# Patient Record
Sex: Male | Born: 1950 | Race: Black or African American | Hispanic: No | Marital: Single | Smoking: Former smoker
Health system: Southern US, Community
[De-identification: ages and names within clinical notes are randomized; demographics above are authoritative.]

## PROBLEM LIST (undated history)

## (undated) DIAGNOSIS — I219 Acute myocardial infarction, unspecified: Secondary | ICD-10-CM

## (undated) DIAGNOSIS — M1990 Unspecified osteoarthritis, unspecified site: Secondary | ICD-10-CM

---

## 2014-10-30 ENCOUNTER — Emergency Department (HOSPITAL_COMMUNITY)
Admission: EM | Admit: 2014-10-30 | Discharge: 2014-10-30 | Disposition: A | Discharge status: Home / Self Care | Payer: Medicare Other | Attending: Emergency Medicine | Admitting: Emergency Medicine

## 2014-10-30 ENCOUNTER — Encounter (HOSPITAL_COMMUNITY): Payer: Self-pay

## 2014-10-30 ENCOUNTER — Emergency Department (HOSPITAL_COMMUNITY): Payer: Medicare Other

## 2014-10-30 DIAGNOSIS — Z7902 Long term (current) use of antithrombotics/antiplatelets: Secondary | ICD-10-CM | POA: Diagnosis not present

## 2014-10-30 DIAGNOSIS — R001 Bradycardia, unspecified: Secondary | ICD-10-CM | POA: Diagnosis not present

## 2014-10-30 DIAGNOSIS — Z7982 Long term (current) use of aspirin: Secondary | ICD-10-CM | POA: Insufficient documentation

## 2014-10-30 DIAGNOSIS — M79602 Pain in left arm: Secondary | ICD-10-CM | POA: Insufficient documentation

## 2014-10-30 DIAGNOSIS — Z87891 Personal history of nicotine dependence: Secondary | ICD-10-CM | POA: Diagnosis not present

## 2014-10-30 DIAGNOSIS — I252 Old myocardial infarction: Secondary | ICD-10-CM | POA: Diagnosis not present

## 2014-10-30 HISTORY — DX: Acute myocardial infarction, unspecified: I21.9

## 2014-10-30 LAB — BASIC METABOLIC PANEL
Anion gap: 7 (ref 5–15)
BUN: 13 mg/dL (ref 6–20)
CALCIUM: 9.3 mg/dL (ref 8.9–10.3)
CO2: 29 mmol/L (ref 22–32)
Chloride: 107 mmol/L (ref 101–111)
Creatinine, Ser: 0.89 mg/dL (ref 0.61–1.24)
GFR calc Af Amer: >60 mL/min (ref >60)
Glucose, Bld: 96 mg/dL (ref 65–99)
POTASSIUM: 4.1 mmol/L (ref 3.5–5.1)
SODIUM: 143 mmol/L (ref 135–145)

## 2014-10-30 LAB — CBC
HEMATOCRIT: 39.4 % (ref 39.0–52.0)
Hemoglobin: 12.6 g/dL — ABNORMAL LOW (ref 13.0–17.0)
MCH: 27 pg (ref 26.0–34.0)
MCHC: 32 g/dL (ref 30.0–36.0)
MCV: 84.5 fL (ref 78.0–100.0)
PLATELETS: 185 10*3/uL (ref 150–400)
RBC: 4.66 MIL/uL (ref 4.22–5.81)
RDW: 14.1 % (ref 11.5–15.5)
WBC: 4.1 10*3/uL (ref 4.0–10.5)

## 2014-10-30 LAB — TROPONIN I

## 2014-10-30 MED ORDER — ACETAMINOPHEN-CODEINE #3 300-30 MG PO TABS: 1.0000 | ORAL_TABLET | Freq: Four times a day (QID) | ORAL | Status: AC | PRN

## 2014-10-30 NOTE — ED Notes (Signed)
Pt presents with L arm pain x 2 days.  Pt reports arm has been hurting since April, reports pain has been intermittent and began again 2 days ago.  Pt denies any radiation of pain, denies shortness of breath or chest pain.

## 2014-10-30 NOTE — ED Provider Notes (Signed)
CSN: [id number]     Arrival date & time 10/30/14  1318 History  This chart was scribed for non-physician practitioner Fayrene Helper, PA, working with Linwood Dibbles, MD, by Tanda Rockers, ED Scribe. This patient was seen in room TR02C/TR02C and the patient's care was started at 7:45 PM.  No chief complaint on file.  The history is provided by the patient. No language interpreter was used.     HPI Comments: Brendan [name] is a 64 y.o. male who is left hand dominant with hx MI (2008) and stents who presents to the Emergency Department complaining of worsening, constant, left shoulder pain radiating down right arm x 4 days. 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 Pt does report that he did yard work approximately 1 week ago and states he could have strained himself doing that. Pt notes generalized weakness for the past 4 days as well, which is unusual for him. He also states his heart rate was low today. His pulse in the ED is 51 bpm. He denies any recent changes to his metoprolol. Denies chest pain, shortness of breath, lightheadedness, dizziness, diaphoresis, or any other associated symptoms. Pt's last stress test was in 03/2014 (approximatley 8 months ago) with no acute findings. Report L arm discomfort is 2/10, no specific treatment tried.    PCP - None Cardiologist - None Pt is previously from [place] but has been living in this area for the past 4 years.    Past Medical History  Diagnosis Date  . Myocardial infarct    History reviewed. No pertinent past surgical history. History reviewed. No pertinent family history. Social History  Substance Use Topics  . Smoking status: Former Games developer  . Smokeless tobacco: None  . Alcohol Use: No    Review of Systems  Constitutional: Negative for diaphoresis.  Respiratory: Negative for shortness of breath.   Cardiovascular: Negative for chest pain.  Musculoskeletal: Positive for  arthralgias (Left shoulder pain. Left arm pain. ).  Neurological: Negative for dizziness and light-headedness.    Allergies  Penicillins  Home Medications   Prior to Admission medications   Medication Sig Start Date End Date Taking? Authorizing Provider  aspirin 325 MG EC tablet Take 325 mg by mouth daily.   Yes Historical Provider, MD  clopidogrel (PLAVIX) 75 MG tablet Take 75 mg by mouth daily.   Yes Historical Provider, MD  metoprolol succinate (TOPROL-XL) 100 MG 24 hr tablet Take 100 mg by mouth daily. Take with or immediately following a meal.   Yes Historical Provider, MD   Triage Vitals: BP 149/77 mmHg  Pulse 51  Temp(Src) 98 F (36.7 C) (Oral)  Resp 18  Ht  (1.676 m)  Wt 185 lb (83.915 kg)  BMI 29.87 kg/m2  SpO2 100%   Physical Exam  Constitutional: He is oriented to person, place, and time. He appears well-developed and well-nourished. No distress.  HENT:  Head: Normocephalic and atraumatic.  Eyes: Conjunctivae and EOM are normal.  Neck: Neck supple. No tracheal deviation present.  Cardiovascular: Regular rhythm, normal heart sounds and intact distal pulses.  Bradycardia present.  Exam reveals no gallop and no friction rub.   No murmur heard. Pulses:      Radial pulses are 2+ on the right side, and 2+ on the left side.  Mild bradycardia noted  Pulmonary/Chest: Effort normal and breath sounds normal. No respiratory distress. He has no wheezes.  He has no rales. He exhibits no tenderness.  Musculoskeletal: Normal range of motion. He exhibits no tenderness (L arm nontender on examination, normal strength).  Neurological: He is alert and oriented to person, place, and time.  Skin: Skin is warm and dry.  Psychiatric: He has a normal mood and affect. His behavior is normal.  Nursing note and vitals reviewed.   ED Course  Procedures (including critical care time)  DIAGNOSTIC STUDIES: Oxygen Saturation is 100% on RA, normal by my interpretation.    COORDINATION  OF CARE: 7:55 PM-pt is L handed, report discomfort in L arm for the past several days.  Did perform yard work prior, so sxs may likely 2/2 his activity.  EKG shows bradycardia but the remainder of his his work up is unremarkable.  Report that he had a recent cardiac stress test in [place] this past Feb and it was unremarkable.  He's currently on Plavix.  I discussed the finding with Dr. Roselyn Bering who felt pt can f/u with cardiology outpt as needed.  I also discuss option of having him admits for obs for cardiac r/o but pt prefers to f/u outpt or return if worsen. Pt currently stable for discharge.   Labs Review Labs Reviewed  CBC - Abnormal; Notable for the following:    Hemoglobin 12.6 (*)    All other components within normal limits  BASIC METABOLIC PANEL  TROPONIN I    Imaging Review Dg Chest 2 View  10/30/2014   CLINICAL DATA:  Left arm tingling. Remote myocardial infarction with stent placement. Hypertension.  EXAM: CHEST  2 VIEW  COMPARISON:  None.  FINDINGS: Thoracic spondylosis. Cardiac and mediastinal margins appear normal. The lungs appear clear. No pleural effusion.  IMPRESSION: 1. No acute radiographic findings. 2. Thoracic spondylosis.   Electronically Signed   By: Gaylyn Rong M.D.   On: 10/30/2014 15:59   I have personally reviewed and evaluated these images and lab results as part of my medical decision-making.   EKG Interpretation   Date/Time:  Thursday October 30 2014 14:34:05 EDT Ventricular Rate:  49 PR Interval:  164 QRS Duration: 88 QT Interval:  428 QTC Calculation: 386 R Axis:   68 Text Interpretation:  Sinus bradycardia Otherwise normal ECG No old  tracing to compare Confirmed by KNAPP  MD-J, JON ([id number]) on 10/30/2014  8:04:45 PM      MDM   Final diagnoses:  Left arm pain    I personally performed the services described in this documentation, which was scribed in my presence. The recorded information has been reviewed and is  accurate.       Fayrene Helper, PA-C 10/30/14 2018  Linwood Dibbles, MD 10/31/14 [phone]

## 2014-10-30 NOTE — Discharge Instructions (Signed)
Follow up with cardiologist for further management of your heart.  Hold off on taking metoprolol for 1 day and resume as it may help with your slow heart rate.  Take tylenol as needed for pain.  Return to ER if your condition worsen or if you have other concerns.   Emergency Department Resource Guide 1) Find a Doctor and Pay Out of Pocket Although you won't have to find out who is covered by your insurance plan, it is a good idea to ask around and get recommendations. You will then need to call the office and see if the doctor you have chosen will accept you as a new patient and what types of options they offer for patients who are self-pay. Some doctors offer discounts or will set up payment plans for their patients who do not have insurance, but you will need to ask so you aren't surprised when you get to your appointment.  2) Contact Your Local Health Department Not all health departments have doctors that can see patients for sick visits, but many do, so it is worth a call to see if yours does. If you don't know where your local health department is, you can check in your phone book. The CDC also has a tool to help you locate your state's health department, and many state websites also have listings of all of their local health departments.  3) Find a Walk-in Clinic If your illness is not likely to be very severe or complicated, you may want to try a walk in clinic. These are popping up all over the country in pharmacies, drugstores, and shopping centers. They're usually staffed by nurse practitioners or physician assistants that have been trained to treat common illnesses and complaints. They're usually fairly quick and inexpensive. However, if you have serious medical issues or chronic medical problems, these are probably not your best option.  No Primary Care Doctor: - Call Health Connect at  [phone] - they can help you locate a primary care doctor that  accepts your insurance, provides certain  services, etc. - Physician Referral Service- [phone]  Chronic Pain Problems: Organization         Address  Phone   Notes  [facility]  [phone] Patients need to be referred by their primary care doctor.   Medication Assistance: Organization         Address  Phone   Notes  [facility] Medication [facility] [address] [place], [place] [address] [phone] --Must be a resident of [facility] -- Must have NO insurance coverage whatsoever (no Medicaid/ Medicare, etc.) -- The pt. MUST have a primary care doctor that directs their care regularly and follows them in the community   [facility]ist  [phone]   [employer]  [phone]    Agencies that provide inexpensive medical care: Organization         Address  Phone   Notes  [facility]  [phone]   [facility]    [phone]   [facility] [address] [place], [place] [address] [phone]   [facility] [address] [place], [place] [phone]   Planned Parenthood    [phone]   [facility]    [phone]   [facility]  [address], [place] Phone:  [phone], Fax:  [phone] Hours of Operation:  9 am - 6 pm, M-F.  Also accepts Medicaid/Medicare and self-pay.  [facility]  [address], [place] Phone: [phone], Fax: [phone]. Hours of Operation:  8:30 am - 5:30 pm, M-F.  Also accepts Medicaid and self-pay.  [facility] [place] [address] [place] Phone: [phone]   [facility] [address] Natasha Bence [place], [place] [phone], Ext. [phone] Mondays & Thursdays: 7-9 AM.  First 15 patients are seen on a first come, first serve basis.    Medicaid-accepting [facility] Providers:  Organization         Address  Phone   Notes  [facility] [address], [place] [phone] Also accepts self-pay patients.  [facility] [address] Laurell Josephs [place], [place]  [phone]   [facility] [address], [place] [phone]   [facility] [address] [place] [phone]   Renaye Rakers [address], [place]   [phone] Only accepts [place] Access [place] patients after they have their name applied to their card.   Self-Pay (no insurance) in [facility]:  Organization         Address  Phone   Notes  Sickle Cell Patients, [facility] [address] [place], [place] [phone]   [facility] [address] [place], [place] [phone]   [facility]  [address], [place] [phone]   [facility]/Dr. Osei-Bonsu  [address], [place] or [address], [place] [phone] Phone number for both [place] and [place] locations is the same.  [facility] [address], [place] [phone]   [facility] [address], [place] or [address] [phone]   [facility] [address], [place] [phone], phone; [phone], fax Sees patients 1st and 3rd Saturday of every month.  Must not qualify for public or private insurance (i.e. Medicaid, Medicare, [place] Health Choice, Veterans' Benefits)  Household income should be no more than 200% of the poverty level The clinic cannot treat you if you are pregnant or think you are pregnant  Sexually transmitted diseases are not treated at the clinic.    Dental Care: Organization         Address  Phone  Notes  [facility] Department of [facility] [address] [place], [place] [phone] Accepts  children up to age 29 who are enrolled in [place] or [place] Health Choice; pregnant women with a Medicaid card; and children who have applied for Medicaid or [place] Health Choice, but were declined, whose parents can pay a reduced fee at time of service.  [facility] Department of [facility]  [address], [place] [phone] Accepts children up to age 70 who are enrolled in [place] or [place] Health Choice; pregnant women with a Medicaid card; and children who have applied for Medicaid or [place] Health Choice, but were declined, whose parents can pay a reduced fee at time of service.  [facility]  [address] [place], [place] [phone] Patients are seen by appointment only. Walk-ins are not accepted. [facility] will see patients 18 years  of age and older. Monday - Tuesday (8am-5pm) Most Wednesdays (8:30-5pm) $30 per visit, cash only  [facility] Adult Dental Access [facility]  [address], [facility] [phone] Patients are seen by appointment only. Walk-ins are not accepted. [facility] will see patients 22 years of age and older. One Wednesday Evening (Monthly: Volunteer Based).  $30 per visit, cash only  [employer]  [phone] for adults; Children under age 53, call Graduate Pediatric Dentistry at [phone]. Children aged 43-14, please call [phone] to request a pediatric application.  Dental services are provided in all areas of dental care including fillings, crowns and bridges, complete and partial dentures, implants, gum treatment, root canals, and extractions. Preventive care is also provided. Treatment is provided to both adults and children. Patients are selected via a lottery and there is often a waiting list.   [facility] [address], [place]  [phone] [email or web address]   [facility] [address] [place], [place] [phone], Ext. [phone] Second and  Fourth Thursday of each month, opens at 6:30 AM; Clinic ends at 9 AM.  Patients are seen on a first-come first-served basis, and a limited number are seen during each clinic.   [facility]  [address] Ether Griffins [place], [place] [phone]   Eligibility Requirements You must have lived in [place], [place], or [place] counties for at least the last three months.   You cannot be eligible for state or federal sponsored [place], including [employer], [place], or [employer].   You generally cannot be eligible for healthcare insurance through your employer.    How to apply: Eligibility screenings are held every Tuesday and Wednesday afternoon from 1:00 pm until 4:00 pm. You do not need an appointment for the interview!  [facility] [address] [place], [place] [phone]   [facility] Health Department  [phone]   [facility] Health Department  [phone]   [facility] Health Department  [phone]    Behavioral Health Resources in the Community: Intensive Outpatient Programs Organization         Address  Phone  Notes  [facility] [address], [place], [place] [phone]   [facility] [address] [place], [place] [phone]   [facility] [address], [place], [place]  [phone]   [facility] [address]  [place], [place] [phone] or [phone]   Substance Abuse Resources Organization         Address  Phone  Notes  [facility] Drug Services  [phone]   [facility]  [phone]   [facility]  [phone]   Floydene Flock  [phone]   Residential & Outpatient Substance Abuse Program  [phone]   Psychological Services Organization         Address  Phone  Notes  [facility]  [phone]   [facility] Services  [phone]   [facility]  [address] [place] [phone] or [phone]    Mobile Crisis Teams Organization         Address  Phone  Notes  [facility], Mobile Crisis Care Unit  [phone]   [facility]  [address] [place], [place] [phone]   [facility] [address] [place] [phone]    Self-Help/Support Groups Organization  Address  Phone             Notes  [facility] [place] - variety of support groups  336- I7437963 Call for more information  Narcotics Anonymous (NA), [facility] [address], [employer] [place]  2 meetings at this location   Statistician         Address  Phone  Notes  [facility] [address],    [place]  [phone]   [facility]  [address] [place] [address], [place], [place] [phone]   [facility] [address] [place], [place] [phone] Admissions: 8am-3pm M-F  [facility] [address]    [place], [place] [phone]   [facility] [address] [place], [place], [place] [phone]   [facility] [address] [place] [address],  [place], [place] [phone]   [facility] - Intensive Outpatient [address] Laurell Josephs [address], [place], [place] [phone]   [facility] ([facility] [address] [place]  [place], [place] [phone] or [phone]   [facility] ([facility]) [address] [place], [place] [phone] Accepts Medicaid  [facility] [address],  [place] [phone] Substance Abuse/Addiction Treatment   [facility] Organization         Address  Phone  Notes  [facility]  [phone]   Angie Fava, PhD [address] Ervin Knack [place], [place]   [phone] or [phone]   [facility] Behavioral   [address] [place], [place] [phone]   [facility] [address], [place], [place] [phone] Insurance/Medicaid/sponsorship through [facility] and Families [address]                                    [place], [place] [phone] Therapy/tele-psych/case  [facility] [address], [place] [phone]    Dr. Lolly Mustache  [phone]   [facility]  [employer] [facility] Dept. 1) [address], [place] 2) [address], [place] 3)  [address], [place] [phone]  [phone]   [facility] Child Abuse Hotline (579)432-4774 or [phone] (After Hours)

## 2014-11-15 ENCOUNTER — Encounter (HOSPITAL_COMMUNITY): Payer: Self-pay | Admitting: *Deleted

## 2014-11-15 ENCOUNTER — Emergency Department (HOSPITAL_COMMUNITY)
Admission: EM | Admit: 2014-11-15 | Discharge: 2014-11-15 | Disposition: A | Discharge status: Home / Self Care | Payer: Medicare Other | Attending: Emergency Medicine | Admitting: Emergency Medicine

## 2014-11-15 DIAGNOSIS — M25512 Pain in left shoulder: Secondary | ICD-10-CM | POA: Diagnosis present

## 2014-11-15 DIAGNOSIS — G8929 Other chronic pain: Secondary | ICD-10-CM | POA: Insufficient documentation

## 2014-11-15 DIAGNOSIS — Z7902 Long term (current) use of antithrombotics/antiplatelets: Secondary | ICD-10-CM | POA: Insufficient documentation

## 2014-11-15 DIAGNOSIS — I252 Old myocardial infarction: Secondary | ICD-10-CM | POA: Diagnosis not present

## 2014-11-15 DIAGNOSIS — Z7982 Long term (current) use of aspirin: Secondary | ICD-10-CM | POA: Insufficient documentation

## 2014-11-15 DIAGNOSIS — Z880 Allergy status to penicillin: Secondary | ICD-10-CM | POA: Diagnosis not present

## 2014-11-15 DIAGNOSIS — Z87891 Personal history of nicotine dependence: Secondary | ICD-10-CM | POA: Diagnosis not present

## 2014-11-15 DIAGNOSIS — M1990 Unspecified osteoarthritis, unspecified site: Secondary | ICD-10-CM | POA: Diagnosis not present

## 2014-11-15 DIAGNOSIS — I219 Acute myocardial infarction, unspecified: Secondary | ICD-10-CM | POA: Insufficient documentation

## 2014-11-15 DIAGNOSIS — M5431 Sciatica, right side: Secondary | ICD-10-CM | POA: Diagnosis not present

## 2014-11-15 HISTORY — DX: Unspecified osteoarthritis, unspecified site: M19.90

## 2014-11-15 NOTE — ED Notes (Signed)
PT reports He has an appt with DR Eden Emms on 11-26-14 . Pt reports he does not have any tylenol #3 for pain,

## 2014-11-15 NOTE — ED Provider Notes (Signed)
CSN: [id number]     Arrival date & time 11/15/14  1014 History  By signing my name below, I, Soijett Blue, attest that this documentation has been prepared under the direction and in the presence of Roxy Horseman, PA-C Electronically Signed: Soijett Blue, ED Scribe. 11/15/2014. 11:08 AM.   Chief Complaint  Patient presents with  . Arm Pain     The history is provided by the patient. No language interpreter was used.    Brendan [name] is a 64 y.o. male with a medical hx of MI in 2008 and Arthritis, who presents to the Emergency Department complaining of left arm pain onset 10/30/14. He notes that he has not slept last night due to the pain. He reports that he has an appointment on 11/23/14 with his cardiologist. He states that he has multiple complaints of his left shoulder pain and his sciatica. He has been trying to get in with the pain management clinic and they don't have any available appointments. He was informed by the pain management clinic to come to the ED due to his symptoms. He denies color change, wound, rash, and any other symptoms. Pt is on plavix at this time.    Past Medical History  Diagnosis Date  . Myocardial infarct (HCC)   . Arthritis    History reviewed. No pertinent past surgical history. History reviewed. No pertinent family history. Social History  Substance Use Topics  . Smoking status: Former Games developer  . Smokeless tobacco: None  . Alcohol Use: No    Review of Systems  Musculoskeletal: Positive for arthralgias.  Skin: Negative for color change, rash and wound.     Allergies  Penicillins  Home Medications   Prior to Admission medications   Medication Sig Start Date End Date Taking? Authorizing Provider  acetaminophen-codeine (TYLENOL #3) 300-30 MG per tablet Take 1 tablet by mouth every 6 (six) hours as needed for moderate pain. 10/30/14   Fayrene Helper, PA-C  aspirin 325 MG EC tablet Take 325 mg by mouth daily.    Historical Provider, MD  clopidogrel  (PLAVIX) 75 MG tablet Take 75 mg by mouth daily.    Historical Provider, MD  metoprolol succinate (TOPROL-XL) 100 MG 24 hr tablet Take 100 mg by mouth daily. Take with or immediately following a meal.    Historical Provider, MD   BP 151/80 mmHg  Pulse 59  Temp(Src) 98.1 F (36.7 C) (Oral)  Resp 18  Ht 5\' 7"  (1.702 m)  Wt 189 lb (85.73 kg)  BMI 29.59 kg/m2  SpO2 100% Physical Exam  Constitutional: He is oriented to person, place, and time. He appears well-developed and well-nourished. No distress.  HENT:  Head: Normocephalic and atraumatic.  Eyes: EOM are normal.  Neck: Neck supple.  Cardiovascular: Normal rate.   Pulmonary/Chest: Effort normal. No respiratory distress.  Musculoskeletal: Normal range of motion.  Neurological: He is alert and oriented to person, place, and time.  Skin: Skin is warm and dry.  Psychiatric: He has a normal mood and affect. His behavior is normal.  Nursing note and vitals reviewed.   ED Course  Procedures (including critical care time) DIAGNOSTIC STUDIES: Oxygen Saturation is 100% on RA, nl by my interpretation.    COORDINATION OF CARE: 11:06 AM Discussed treatment plan with pt at bedside which includes tylenol and pt agreed to plan.     MDM   Final diagnoses:  Chronic pain  Sciatica of right side  Left shoulder pain    Patient with chronic  pain requesting additional pain medications.  I do not feel that prescription medication would be beneficial to patient's overall health, and these will no be prescribed in the ED.  Recommend outpatient pain management follow-up.  Patient has persistent left shoulder pain, thought to be impingement vs arthritis.  He also has sciatica, but does not have any sign of cauda equina.  Outpatient follow-up recommended.  I, Ioanna Colquhoun, personally performed the services described in this documentation. All medical record entries made by the scribe were at my direction and in my presence.  I have reviewed  the chart and discharge instructions and agree that the record reflects my personal performance and is accurate and complete. Eulia Hatcher.  11/15/2014. 11:13 AM.      Roxy Horseman, PA-C 11/15/14 1113  Nelva Nay, MD 11/16/14 [phone]

## 2014-11-15 NOTE — Discharge Instructions (Signed)
Chronic Pain Discharge Instructions  Emergency care providers appreciate that many patients coming to [place] are in severe pain and we wish to address their pain in the safest, most responsible manner.  It is important to recognize however, that the proper treatment of chronic pain differs from that of the pain of injuries and acute illnesses.  Our goal is to provide quality, safe, personalized care and we thank you for giving [place] the opportunity to serve you. The use of narcotics and related agents for chronic pain syndromes may lead to additional physical and psychological problems.  Nearly as many people die from prescription narcotics each year as die from car crashes.  Additionally, this risk is increased if such prescriptions are obtained from a variety of sources.  Therefore, only your primary care physician or a pain management specialist is able to safely treat such syndromes with narcotic medications long-term.    Documentation revealing such prescriptions have been sought from multiple sources may prohibit [place] from providing a refill or different narcotic medication.  Your name may be checked first through the [facility] Controlled Substances Reporting System.  This database is a record of controlled substance medication prescriptions that the patient has received.  This has been established by [facility] in an effort to eliminate the dangerous, and often life threatening, practice of obtaining multiple prescriptions from different medical providers.   If you have a chronic pain syndrome (i.e. chronic headaches, recurrent back or neck pain, dental pain, abdominal or pelvis pain without a specific diagnosis, or neuropathic pain such as fibromyalgia) or recurrent visits for the same condition without an acute diagnosis, you may be treated with non-narcotics and other non-addictive medicines.  Allergic reactions or negative side effects that may be reported by a patient to such medications will not  typically lead to the use of a narcotic analgesic or other controlled substance as an alternative.   Patients managing chronic pain with a personal physician should have provisions in place for breakthrough pain.  If you are in crisis, you should call your physician.  If your physician directs you to the emergency department, please have the doctor call and speak to our attending physician concerning your care.   When patients come to the Emergency Department (ED) with acute medical conditions in which the Emergency Department physician feels appropriate to prescribe narcotic or sedating pain medication, the physician will prescribe these in very limited quantities.  The amount of these medications will last only until you can see your primary care physician in his/her office.  Any patient who returns to the ED seeking refills should expect only non-narcotic pain medications.   In the event of an acute medical condition exists and the emergency physician feels it is necessary that the patient be given a narcotic or sedating medication -  a responsible adult driver should be present in the room prior to the medication being given by the nurse.   Prescriptions for narcotic or sedating medications that have been lost, stolen or expired will not be refilled in the Emergency Department.    Patients who have chronic pain may receive non-narcotic prescriptions until seen by their primary care physician.  It is every patients personal responsibility to maintain active prescriptions with his or her primary care physician or specialist. Impingement Syndrome, Rotator Cuff, Bursitis With Rehab Impingement syndrome is a condition that involves inflammation of the tendons of the rotator cuff and the subacromial bursa, that causes pain in the shoulder. The rotator cuff  consists of four tendons and muscles that control much of the shoulder and upper arm function. The subacromial bursa is a fluid filled sac that helps  reduce friction between the rotator cuff and one of the bones of the shoulder (acromion). Impingement syndrome is usually an overuse injury that causes swelling of the bursa (bursitis), swelling of the tendon (tendonitis), and/or a tear of the tendon (strain). Strains are classified into three categories. Grade 1 strains cause pain, but the tendon is not lengthened. Grade 2 strains include a lengthened ligament, due to the ligament being stretched or partially ruptured. With grade 2 strains there is still function, although the function may be decreased. Grade 3 strains include a complete tear of the tendon or muscle, and function is usually impaired. SYMPTOMS   Pain around the shoulder, often at the outer portion of the upper arm.  Pain that gets worse with shoulder function, especially when reaching overhead or lifting.  Sometimes, aching when not using the arm.  Pain that wakes you up at night.  Sometimes, tenderness, swelling, warmth, or redness over the affected area.  Loss of strength.  Limited motion of the shoulder, especially reaching behind the back (to the back pocket or to unhook bra) or across your body.  Crackling sound (crepitation) when moving the arm.  Biceps tendon pain and inflammation (in the front of the shoulder). Worse when bending the elbow or lifting. CAUSES  Impingement syndrome is often an overuse injury, in which chronic (repetitive) motions cause the tendons or bursa to become inflamed. A strain occurs when a force is paced on the tendon or muscle that is greater than it can withstand. Common mechanisms of injury include: Stress from sudden increase in duration, frequency, or intensity of training.  Direct hit (trauma) to the shoulder.  Aging, erosion of the tendon with normal use.  Bony bump on shoulder (acromial spur). RISK INCREASES WITH:  Contact sports (football, wrestling, boxing).  Throwing sports (baseball, tennis, volleyball).  Weightlifting  and bodybuilding.  Heavy labor.  Previous injury to the rotator cuff, including impingement.  Poor shoulder strength and flexibility.  Failure to warm up properly before activity.  Inadequate protective equipment.  Old age.  Bony bump on shoulder (acromial spur). PREVENTION   Warm up and stretch properly before activity.  Allow for adequate recovery between workouts.  Maintain physical fitness:  Strength, flexibility, and endurance.  Cardiovascular fitness.  Learn and use proper exercise technique. PROGNOSIS  If treated properly, impingement syndrome usually goes away within 6 weeks. Sometimes surgery is required.  RELATED COMPLICATIONS   Longer healing time if not properly treated, or if not given enough time to heal.  Recurring symptoms, that result in a chronic condition.  Shoulder stiffness, frozen shoulder, or loss of motion.  Rotator cuff tendon tear.  Recurring symptoms, especially if activity is resumed too soon, with overuse, with a direct blow, or when using poor technique. TREATMENT  Treatment first involves the use of ice and medicine, to reduce pain and inflammation. The use of strengthening and stretching exercises may help reduce pain with activity. These exercises may be performed at home or with a therapist. If non-surgical treatment is unsuccessful after more than 6 months, surgery may be advised. After surgery and rehabilitation, activity is usually possible in 3 months.  MEDICATION  If pain medicine is needed, nonsteroidal anti-inflammatory medicines (aspirin and ibuprofen), or other minor pain relievers (acetaminophen), are often advised.  Do not take pain medicine for 7 days before surgery.  Prescription pain relievers may be given, if your caregiver thinks they are needed. Use only as directed and only as much as you need.  Corticosteroid injections may be given by your caregiver. These injections should be reserved for the most serious cases,  because they may only be given a certain number of times. HEAT AND COLD  Cold treatment (icing) should be applied for 10 to 15 minutes every 2 to 3 hours for inflammation and pain, and immediately after activity that aggravates your symptoms. Use ice packs or an ice massage.  Heat treatment may be used before performing stretching and strengthening activities prescribed by your caregiver, physical therapist, or athletic trainer. Use a heat pack or a warm water soak. SEEK MEDICAL CARE IF:   Symptoms get worse or do not improve in 4 to 6 weeks, despite treatment.  New, unexplained symptoms develop. (Drugs used in treatment may produce side effects.) EXERCISES  RANGE OF MOTION (ROM) AND STRETCHING EXERCISES - Impingement Syndrome (Rotator Cuff  Tendinitis, Bursitis) These exercises may help you when beginning to rehabilitate your injury. Your symptoms may go away with or without further involvement from your physician, physical therapist or athletic trainer. While completing these exercises, remember:   Restoring tissue flexibility helps normal motion to return to the joints. This allows healthier, less painful movement and activity.  An effective stretch should be held for at least 30 seconds.  A stretch should never be painful. You should only feel a gentle lengthening or release in the stretched tissue. STRETCH - Flexion, Standing  Stand with good posture. With an underhand grip on your right / left hand, and an overhand grip on the opposite hand, grasp a broomstick or cane so that your hands are a little more than shoulder width apart.  Keeping your right / left elbow straight and shoulder muscles relaxed, push the stick with your opposite hand, to raise your right / left arm in front of your body and then overhead. Raise your arm until you feel a stretch in your right / left shoulder, but before you have increased shoulder pain.  Try to avoid shrugging your right / left shoulder as your  arm rises, by keeping your shoulder blade tucked down and toward your mid-back spine. Hold for __________ seconds.  Slowly return to the starting position. Repeat __________ times. Complete this exercise __________ times per day. STRETCH - Abduction, Supine  Lie on your back. With an underhand grip on your right / left hand and an overhand grip on the opposite hand, grasp a broomstick or cane so that your hands are a little more than shoulder width apart.  Keeping your right / left elbow straight and your shoulder muscles relaxed, push the stick with your opposite hand, to raise your right / left arm out to the side of your body and then overhead. Raise your arm until you feel a stretch in your right / left shoulder, but before you have increased shoulder pain.  Try to avoid shrugging your right / left shoulder as your arm rises, by keeping your shoulder blade tucked down and toward your mid-back spine. Hold for __________ seconds.  Slowly return to the starting position. Repeat __________ times. Complete this exercise __________ times per day. ROM - Flexion, Active-Assisted  Lie on your back. You may bend your knees for comfort.  Grasp a broomstick or cane so your hands are about shoulder width apart. Your right / left hand should grip the end of the stick, so  that your hand is positioned "thumbs-up," as if you were about to shake hands.  Using your healthy arm to lead, raise your right / left arm overhead, until you feel a gentle stretch in your shoulder. Hold for __________ seconds.  Use the stick to assist in returning your right / left arm to its starting position. Repeat __________ times. Complete this exercise __________ times per day.  ROM - Internal Rotation, Supine   Lie on your back on a firm surface. Place your right / left elbow about 60 degrees away from your side. Elevate your elbow with a folded towel, so that the elbow and shoulder are the same height.  Using a broomstick  or cane and your strong arm, pull your right / left hand toward your body until you feel a gentle stretch, but no increase in your shoulder pain. Keep your shoulder and elbow in place throughout the exercise.  Hold for __________ seconds. Slowly return to the starting position. Repeat __________ times. Complete this exercise __________ times per day. STRETCH - Internal Rotation  Place your right / left hand behind your back, palm up.  Throw a towel or belt over your opposite shoulder. Grasp the towel with your right / left hand.  While keeping an upright posture, gently pull up on the towel, until you feel a stretch in the front of your right / left shoulder.  Avoid shrugging your right / left shoulder as your arm rises, by keeping your shoulder blade tucked down and toward your mid-back spine.  Hold for __________ seconds. Release the stretch, by lowering your healthy hand. Repeat __________ times. Complete this exercise __________ times per day. ROM - Internal Rotation   Using an underhand grip, grasp a stick behind your back with both hands.  While standing upright with good posture, slide the stick up your back until you feel a mild stretch in the front of your shoulder.  Hold for __________ seconds. Slowly return to your starting position. Repeat __________ times. Complete this exercise __________ times per day.  STRETCH - Posterior Shoulder Capsule   Stand or sit with good posture. Grasp your right / left elbow and draw it across your chest, keeping it at the same height as your shoulder.  Pull your elbow, so your upper arm comes in closer to your chest. Pull until you feel a gentle stretch in the back of your shoulder.  Hold for __________ seconds. Repeat __________ times. Complete this exercise __________ times per day. STRENGTHENING EXERCISES - Impingement Syndrome (Rotator Cuff Tendinitis, Bursitis) These exercises may help you when beginning to rehabilitate your injury.  They may resolve your symptoms with or without further involvement from your physician, physical therapist or athletic trainer. While completing these exercises, remember:  Muscles can gain both the endurance and the strength needed for everyday activities through controlled exercises.  Complete these exercises as instructed by your physician, physical therapist or athletic trainer. Increase the resistance and repetitions only as guided.  You may experience muscle soreness or fatigue, but the pain or discomfort you are trying to eliminate should never worsen during these exercises. If this pain does get worse, stop and make sure you are following the directions exactly. If the pain is still present after adjustments, discontinue the exercise until you can discuss the trouble with your clinician.  During your recovery, avoid activity or exercises which involve actions that place your injured hand or elbow above your head or behind your back or head. These positions  stress the tissues which you are trying to heal. STRENGTH - Scapular Depression and Adduction   With good posture, sit on a firm chair. Support your arms in front of you, with pillows, arm rests, or on a table top. Have your elbows in line with the sides of your body.  Gently draw your shoulder blades down and toward your mid-back spine. Gradually increase the tension, without tensing the muscles along the top of your shoulders and the back of your neck.  Hold for __________ seconds. Slowly release the tension and relax your muscles completely before starting the next repetition.  After you have practiced this exercise, remove the arm support and complete the exercise in standing as well as sitting position. Repeat __________ times. Complete this exercise __________ times per day.  STRENGTH - Shoulder Abductors, Isometric  With good posture, stand or sit about 4-6 inches from a wall, with your right / left side facing the wall.  Bend  your right / left elbow. Gently press your right / left elbow into the wall. Increase the pressure gradually, until you are pressing as hard as you can, without shrugging your shoulder or increasing any shoulder discomfort.  Hold for __________ seconds.  Release the tension slowly. Relax your shoulder muscles completely before you begin the next repetition. Repeat __________ times. Complete this exercise __________ times per day.  STRENGTH - External Rotators, Isometric  Keep your right / left elbow at your side and bend it 90 degrees.  Step into a door frame so that the outside of your right / left wrist can press against the door frame without your upper arm leaving your side.  Gently press your right / left wrist into the door frame, as if you were trying to swing the back of your hand away from your stomach. Gradually increase the tension, until you are pressing as hard as you can, without shrugging your shoulder or increasing any shoulder discomfort.  Hold for __________ seconds.  Release the tension slowly. Relax your shoulder muscles completely before you begin the next repetition. Repeat __________ times. Complete this exercise __________ times per day.  STRENGTH - Supraspinatus   Stand or sit with good posture. Grasp a __________ weight, or an exercise band or tubing, so that your hand is "thumbs-up," like you are shaking hands.  Slowly lift your right / left arm in a "V" away from your thigh, diagonally into the space between your side and straight ahead. Lift your hand to shoulder height or as far as you can, without increasing any shoulder pain. At first, many people do not lift their hands above shoulder height.  Avoid shrugging your right / left shoulder as your arm rises, by keeping your shoulder blade tucked down and toward your mid-back spine.  Hold for __________ seconds. Control the descent of your hand, as you slowly return to your starting position. Repeat __________  times. Complete this exercise __________ times per day.  STRENGTH - External Rotators  Secure a rubber exercise band or tubing to a fixed object (table, pole) so that it is at the same height as your right / left elbow when you are standing or sitting on a firm surface.  Stand or sit so that the secured exercise band is at your uninjured side.  Bend your right / left elbow 90 degrees. Place a folded towel or small pillow under your right / left arm, so that your elbow is a few inches away from your side.  Keeping  the tension on the exercise band, pull it away from your body, as if pivoting on your elbow. Be sure to keep your body steady, so that the movement is coming only from your rotating shoulder.  Hold for __________ seconds. Release the tension in a controlled manner, as you return to the starting position. Repeat __________ times. Complete this exercise __________ times per day.  STRENGTH - Internal Rotators   Secure a rubber exercise band or tubing to a fixed object (table, pole) so that it is at the same height as your right / left elbow when you are standing or sitting on a firm surface.  Stand or sit so that the secured exercise band is at your right / left side.  Bend your elbow 90 degrees. Place a folded towel or small pillow under your right / left arm so that your elbow is a few inches away from your side.  Keeping the tension on the exercise band, pull it across your body, toward your stomach. Be sure to keep your body steady, so that the movement is coming only from your rotating shoulder.  Hold for __________ seconds. Release the tension in a controlled manner, as you return to the starting position. Repeat __________ times. Complete this exercise __________ times per day.  STRENGTH - Scapular Protractors, Standing   Stand arms length away from a wall. Place your hands on the wall, keeping your elbows straight.  Begin by dropping your shoulder blades down and toward  your mid-back spine.  To strengthen your protractors, keep your shoulder blades down, but slide them forward on your rib cage. It will feel as if you are lifting the back of your rib cage away from the wall. This is a subtle motion and can be challenging to complete. Ask your caregiver for further instruction, if you are not sure you are doing the exercise correctly.  Hold for __________ seconds. Slowly return to the starting position, resting the muscles completely before starting the next repetition. Repeat __________ times. Complete this exercise __________ times per day. STRENGTH - Scapular Protractors, Supine  Lie on your back on a firm surface. Extend your right / left arm straight into the air while holding a __________ weight in your hand.  Keeping your head and back in place, lift your shoulder off the floor.  Hold for __________ seconds. Slowly return to the starting position, and allow your muscles to relax completely before starting the next repetition. Repeat __________ times. Complete this exercise __________ times per day. STRENGTH - Scapular Protractors, Quadruped  Get onto your hands and knees, with your shoulders directly over your hands (or as close as you can be, comfortably).  Keeping your elbows locked, lift the back of your rib cage up into your shoulder blades, so your mid-back rounds out. Keep your neck muscles relaxed.  Hold this position for __________ seconds. Slowly return to the starting position and allow your muscles to relax completely before starting the next repetition. Repeat __________ times. Complete this exercise __________ times per day.  STRENGTH - Scapular Retractors  Secure a rubber exercise band or tubing to a fixed object (table, pole), so that it is at the height of your shoulders when you are either standing, or sitting on a firm armless chair.  With a palm down grip, grasp an end of the band in each hand. Straighten your elbows and lift your  hands straight in front of you, at shoulder height. Step back, away from the secured end  of the band, until it becomes tense.  Squeezing your shoulder blades together, draw your elbows back toward your sides, as you bend them. Keep your upper arms lifted away from your body throughout the exercise.  Hold for __________ seconds. Slowly ease the tension on the band, as you reverse the directions and return to the starting position. Repeat __________ times. Complete this exercise __________ times per day. STRENGTH - Shoulder Extensors   Secure a rubber exercise band or tubing to a fixed object (table, pole) so that it is at the height of your shoulders when you are either standing, or sitting on a firm armless chair.  With a thumbs-up grip, grasp an end of the band in each hand. Straighten your elbows and lift your hands straight in front of you, at shoulder height. Step back, away from the secured end of the band, until it becomes tense.  Squeezing your shoulder blades together, pull your hands down to the sides of your thighs. Do not allow your hands to go behind you.  Hold for __________ seconds. Slowly ease the tension on the band, as you reverse the directions and return to the starting position. Repeat __________ times. Complete this exercise __________ times per day.  STRENGTH - Scapular Retractors and External Rotators   Secure a rubber exercise band or tubing to a fixed object (table, pole) so that it is at the height as your shoulders, when you are either standing, or sitting on a firm armless chair.  With a palm down grip, grasp an end of the band in each hand. Bend your elbows 90 degrees and lift your elbows to shoulder height, at your sides. Step back, away from the secured end of the band, until it becomes tense.  Squeezing your shoulder blades together, rotate your shoulders so that your upper arms and elbows remain stationary, but your fists travel upward to head height.  Hold  for __________ seconds. Slowly ease the tension on the band, as you reverse the directions and return to the starting position. Repeat __________ times. Complete this exercise __________ times per day.  STRENGTH - Scapular Retractors and External Rotators, Rowing   Secure a rubber exercise band or tubing to a fixed object (table, pole) so that it is at the height of your shoulders, when you are either standing, or sitting on a firm armless chair.  With a palm down grip, grasp an end of the band in each hand. Straighten your elbows and lift your hands straight in front of you, at shoulder height. Step back, away from the secured end of the band, until it becomes tense.  Step 1: Squeeze your shoulder blades together. Bending your elbows, draw your hands to your chest, as if you are rowing a boat. At the end of this motion, your hands and elbow should be at shoulder height and your elbows should be out to your sides.  Step 2: Rotate your shoulders, to raise your hands above your head. Your forearms should be vertical and your upper arms should be horizontal.  Hold for __________ seconds. Slowly ease the tension on the band, as you reverse the directions and return to the starting position. Repeat __________ times. Complete this exercise __________ times per day.  STRENGTH - Scapular Depressors  Find a sturdy chair without wheels, such as a dining room chair.  Keeping your feet on the floor, and your hands on the chair arms, lift your bottom up from the seat, and lock your elbows.  Keeping your elbows straight, allow gravity to pull your body weight down. Your shoulders will rise toward your ears.  Raise your body against gravity by drawing your shoulder blades down your back, shortening the distance between your shoulders and ears. Although your feet should always maintain contact with the floor, your feet should progressively support less body weight, as you get stronger.  Hold for __________  seconds. In a controlled and slow manner, lower your body weight to begin the next repetition. Repeat __________ times. Complete this exercise __________ times per day.    This information is not intended to replace advice given to you by your health care provider. Make sure you discuss any questions you have with your health care provider.   Document Released: 01/24/2005 Document Revised: 02/14/2014 Document Reviewed: 05/08/2008 Else[employer] Interactive Patient Education 2016 [employer] Sciatica With Rehab The sciatic nerve runs from the back down the leg and is responsible for sensation and control of the muscles in the back (posterior) side of the thigh, lower leg, and foot. Sciatica is a condition that is characterized by inflammation of this nerve.  SYMPTOMS   Signs of nerve damage, including numbness and/or weakness along the posterior side of the lower extremity.  Pain in the back of the thigh that may also travel down the leg.  Pain that worsens when sitting for long periods of time.  Occasionally, pain in the back or buttock. CAUSES  Inflammation of the sciatic nerve is the cause of sciatica. The inflammation is due to something irritating the nerve. Common sources of irritation include:  Sitting for long periods of time.  Direct trauma to the nerve.  Arthritis of the spine.  Herniated or ruptured disk.  Slipping of the vertebrae (spondylolisthesis).  Pressure from soft tissues, such as muscles or ligament-like tissue (fascia). RISK INCREASES WITH:  Sports that place pressure or stress on the spine (football or weightlifting).  Poor strength and flexibility.  Failure to warm up properly before activity.  Family history of low back pain or disk disorders.  Previous back injury or surgery.  Poor body mechanics, especially when lifting, or poor posture. PREVENTION   Warm up and stretch properly before activity.  Maintain physical fitness:  Strength,  flexibility, and endurance.  Cardiovascular fitness.  Learn and use proper technique, especially with posture and lifting. When possible, have coach correct improper technique.  Avoid activities that place stress on the spine. PROGNOSIS If treated properly, then sciatica usually resolves within 6 weeks. However, occasionally surgery is necessary.  RELATED COMPLICATIONS   Permanent nerve damage, including pain, numbness, tingle, or weakness.  Chronic back pain.  Risks of surgery: infection, bleeding, nerve damage, or damage to surrounding tissues. TREATMENT Treatment initially involves resting from any activities that aggravate your symptoms. The use of ice and medication may help reduce pain and inflammation. The use of strengthening and stretching exercises may help reduce pain with activity. These exercises may be performed at home or with referral to a therapist. A therapist may recommend further treatments, such as transcutaneous electronic nerve stimulation (TENS) or ultrasound. Your caregiver may recommend corticosteroid injections to help reduce inflammation of the sciatic nerve. If symptoms persist despite non-surgical (conservative) treatment, then surgery may be recommended. MEDICATION  If pain medication is necessary, then nonsteroidal anti-inflammatory medications, such as aspirin and ibuprofen, or other minor pain relievers, such as acetaminophen, are often recommended.  Do not take pain medication for 7 days before surgery.  Prescription pain relievers may be given  if deemed necessary by your caregiver. Use only as directed and only as much as you need.  Ointments applied to the skin may be helpful.  Corticosteroid injections may be given by your caregiver. These injections should be reserved for the most serious cases, because they may only be given a certain number of times. HEAT AND COLD  Cold treatment (icing) relieves pain and reduces inflammation. Cold treatment  should be applied for 10 to 15 minutes every 2 to 3 hours for inflammation and pain and immediately after any activity that aggravates your symptoms. Use ice packs or massage the area with a piece of ice (ice massage).  Heat treatment may be used prior to performing the stretching and strengthening activities prescribed by your caregiver, physical therapist, or athletic trainer. Use a heat pack or soak the injury in warm water. SEEK MEDICAL CARE IF:  Treatment seems to offer no benefit, or the condition worsens.  Any medications produce adverse side effects. EXERCISES  RANGE OF MOTION (ROM) AND STRETCHING EXERCISES - Sciatica Most people with sciatic will find that their symptoms worsen with either excessive bending forward (flexion) or arching at the low back (extension). The exercises which will help resolve your symptoms will focus on the opposite motion. Your physician, physical therapist or athletic trainer will help you determine which exercises will be most helpful to resolve your low back pain. Do not complete any exercises without first consulting with your clinician. Discontinue any exercises which worsen your symptoms until you speak to your clinician. If you have pain, numbness or tingling which travels down into your buttocks, leg or foot, the goal of the therapy is for these symptoms to move closer to your back and eventually resolve. Occasionally, these leg symptoms will get better, but your low back pain may worsen; this is typically an indication of progress in your rehabilitation. Be certain to be very alert to any changes in your symptoms and the activities in which you participated in the 24 hours prior to the change. Sharing this information with your clinician will allow him/her to most efficiently treat your condition. These exercises may help you when beginning to rehabilitate your injury. Your symptoms may resolve with or without further involvement from your physician, physical  therapist or athletic trainer. While completing these exercises, remember:   Restoring tissue flexibility helps normal motion to return to the joints. This allows healthier, less painful movement and activity.  An effective stretch should be held for at least 30 seconds.  A stretch should never be painful. You should only feel a gentle lengthening or release in the stretched tissue. FLEXION RANGE OF MOTION AND STRETCHING EXERCISES: STRETCH - Flexion, Single Knee to Chest   Lie on a firm bed or floor with both legs extended in front of you.  Keeping one leg in contact with the floor, bring your opposite knee to your chest. Hold your leg in place by either grabbing behind your thigh or at your knee.  Pull until you feel a gentle stretch in your low back. Hold __________ seconds.  Slowly release your grasp and repeat the exercise with the opposite side. Repeat __________ times. Complete this exercise __________ times per day.  STRETCH - Flexion, Double Knee to Chest  Lie on a firm bed or floor with both legs extended in front of you.  Keeping one leg in contact with the floor, bring your opposite knee to your chest.  Tense your stomach muscles to support your back and then  lift your other knee to your chest. Hold your legs in place by either grabbing behind your thighs or at your knees.  Pull both knees toward your chest until you feel a gentle stretch in your low back. Hold __________ seconds.  Tense your stomach muscles and slowly return one leg at a time to the floor. Repeat __________ times. Complete this exercise __________ times per day.  STRETCH - Low Trunk Rotation   Lie on a firm bed or floor. Keeping your legs in front of you, bend your knees so they are both pointed toward the ceiling and your feet are flat on the floor.  Extend your arms out to the side. This will stabilize your upper body by keeping your shoulders in contact with the floor.  Gently and slowly drop both  knees together to one side until you feel a gentle stretch in your low back. Hold for __________ seconds.  Tense your stomach muscles to support your low back as you bring your knees back to the starting position. Repeat the exercise to the other side. Repeat __________ times. Complete this exercise __________ times per day  EXTENSION RANGE OF MOTION AND FLEXIBILITY EXERCISES: STRETCH - Extension, Prone on Elbows  Lie on your stomach on the floor, a bed will be too soft. Place your palms about shoulder width apart and at the height of your head.  Place your elbows under your shoulders. If this is too painful, stack pillows under your chest.  Allow your body to relax so that your hips drop lower and make contact more completely with the floor.  Hold this position for __________ seconds.  Slowly return to lying flat on the floor. Repeat __________ times. Complete this exercise __________ times per day.  RANGE OF MOTION - Extension, Prone Press Ups  Lie on your stomach on the floor, a bed will be too soft. Place your palms about shoulder width apart and at the height of your head.  Keeping your back as relaxed as possible, slowly straighten your elbows while keeping your hips on the floor. You may adjust the placement of your hands to maximize your comfort. As you gain motion, your hands will come more underneath your shoulders.  Hold this position __________ seconds.  Slowly return to lying flat on the floor. Repeat __________ times. Complete this exercise __________ times per day.  STRENGTHENING EXERCISES - Sciatica  These exercises may help you when beginning to rehabilitate your injury. These exercises should be done near your "sweet spot." This is the neutral, low-back arch, somewhere between fully rounded and fully arched, that is your least painful position. When performed in this safe range of motion, these exercises can be used for people who have either a flexion or extension based  injury. These exercises may resolve your symptoms with or without further involvement from your physician, physical therapist or athletic trainer. While completing these exercises, remember:   Muscles can gain both the endurance and the strength needed for everyday activities through controlled exercises.  Complete these exercises as instructed by your physician, physical therapist or athletic trainer. Progress with the resistance and repetition exercises only as your caregiver advises.  You may experience muscle soreness or fatigue, but the pain or discomfort you are trying to eliminate should never worsen during these exercises. If this pain does worsen, stop and make certain you are following the directions exactly. If the pain is still present after adjustments, discontinue the exercise until you can discuss the trouble with  your clinician. STRENGTHENING - Deep Abdominals, Pelvic Tilt   Lie on a firm bed or floor. Keeping your legs in front of you, bend your knees so they are both pointed toward the ceiling and your feet are flat on the floor.  Tense your lower abdominal muscles to press your low back into the floor. This motion will rotate your pelvis so that your tail bone is scooping upwards rather than pointing at your feet or into the floor.  With a gentle tension and even breathing, hold this position for __________ seconds. Repeat __________ times. Complete this exercise __________ times per day.  STRENGTHENING - Abdominals, Crunches   Lie on a firm bed or floor. Keeping your legs in front of you, bend your knees so they are both pointed toward the ceiling and your feet are flat on the floor. Cross your arms over your chest.  Slightly tip your chin down without bending your neck.  Tense your abdominals and slowly lift your trunk high enough to just clear your shoulder blades. Lifting higher can put excessive stress on the low back and does not further strengthen your abdominal  muscles.  Control your return to the starting position. Repeat __________ times. Complete this exercise __________ times per day.  STRENGTHENING - Quadruped, Opposite UE/LE Lift  Assume a hands and knees position on a firm surface. Keep your hands under your shoulders and your knees under your hips. You may place padding under your knees for comfort.  Find your neutral spine and gently tense your abdominal muscles so that you can maintain this position. Your shoulders and hips should form a rectangle that is parallel with the floor and is not twisted.  Keeping your trunk steady, lift your right hand no higher than your shoulder and then your left leg no higher than your hip. Make sure you are not holding your breath. Hold this position __________ seconds.  Continuing to keep your abdominal muscles tense and your back steady, slowly return to your starting position. Repeat with the opposite arm and leg. Repeat __________ times. Complete this exercise __________ times per day.  STRENGTHENING - Abdominals and Quadriceps, Straight Leg Raise   Lie on a firm bed or floor with both legs extended in front of you.  Keeping one leg in contact with the floor, bend the other knee so that your foot can rest flat on the floor.  Find your neutral spine, and tense your abdominal muscles to maintain your spinal position throughout the exercise.  Slowly lift your straight leg off the floor about 6 inches for a count of 15, making sure to not hold your breath.  Still keeping your neutral spine, slowly lower your leg all the way to the floor. Repeat this exercise with each leg __________ times. Complete this exercise __________ times per day. POSTURE AND BODY MECHANICS CONSIDERATIONS - Sciatica Keeping correct posture when sitting, standing or completing your activities will reduce the stress put on different body tissues, allowing injured tissues a chance to heal and limiting painful experiences. The  following are general guidelines for improved posture. Your physician or physical therapist will provide you with any instructions specific to your needs. While reading these guidelines, remember:  The exercises prescribed by your provider will help you have the flexibility and strength to maintain correct postures.  The correct posture provides the optimal environment for your joints to work. All of your joints have less wear and tear when properly supported by a spine with good posture.  This means you will experience a healthier, less painful body.  Correct posture must be practiced with all of your activities, especially prolonged sitting and standing. Correct posture is as important when doing repetitive low-stress activities (typing) as it is when doing a single heavy-load activity (lifting). RESTING POSITIONS Consider which positions are most painful for you when choosing a resting position. If you have pain with flexion-based activities (sitting, bending, stooping, squatting), choose a position that allows you to rest in a less flexed posture. You would want to avoid curling into a fetal position on your side. If your pain worsens with extension-based activities (prolonged standing, working overhead), avoid resting in an extended position such as sleeping on your stomach. Most people will find more comfort when they rest with their spine in a more neutral position, neither too rounded nor too arched. Lying on a non-sagging bed on your side with a pillow between your knees, or on your back with a pillow under your knees will often provide some relief. Keep in mind, being in any one position for a prolonged period of time, no matter how correct your posture, can still lead to stiffness. PROPER SITTING POSTURE In order to minimize stress and discomfort on your spine, you must sit with correct posture Sitting with good posture should be effortless for a healthy body. Returning to good posture is a  gradual process. Many people can work toward this most comfortably by using various supports until they have the flexibility and strength to maintain this posture on their own. When sitting with proper posture, your ears will fall over your shoulders and your shoulders will fall over your hips. You should use the back of the chair to support your upper back. Your low back will be in a neutral position, just slightly arched. You may place a small pillow or folded towel at the base of your low back for support.  When working at a desk, create an environment that supports good, upright posture. Without extra support, muscles fatigue and lead to excessive strain on joints and other tissues. Keep these recommendations in mind: CHAIR:   A chair should be able to slide under your desk when your back makes contact with the back of the chair. This allows you to work closely.  The chair's height should allow your eyes to be level with the upper part of your monitor and your hands to be slightly lower than your elbows. BODY POSITION  Your feet should make contact with the floor. If this is not possible, use a foot rest.  Keep your ears over your shoulders. This will reduce stress on your neck and low back. INCORRECT SITTING POSTURES   If you are feeling tired and unable to assume a healthy sitting posture, do not slouch or slump. This puts excessive strain on your back tissues, causing more damage and pain. Healthier options include:  Using more support, like a lumbar pillow.  Switching tasks to something that requires you to be upright or walking.  Talking a brief walk.  Lying down to rest in a neutral-spine position. PROLONGED STANDING WHILE SLIGHTLY LEANING FORWARD  When completing a task that requires you to lean forward while standing in one place for a long time, place either foot up on a stationary 2-4 inch high object to help maintain the best posture. When both feet are on the ground, the low  back tends to lose its slight inward curve. If this curve flattens (or becomes too large), then  the back and your other joints will experience too much stress, fatigue more quickly and can cause pain.  CORRECT STANDING POSTURES Proper standing posture should be assumed with all daily activities, even if they only take a few moments, like when brushing your teeth. As in sitting, your ears should fall over your shoulders and your shoulders should fall over your hips. You should keep a slight tension in your abdominal muscles to brace your spine. Your tailbone should point down to the ground, not behind your body, resulting in an over-extended swayback posture.  INCORRECT STANDING POSTURES  Common incorrect standing postures include a forward head, locked knees and/or an excessive swayback. WALKING Walk with an upright posture. Your ears, shoulders and hips should all line-up. PROLONGED ACTIVITY IN A FLEXED POSITION When completing a task that requires you to bend forward at your waist or lean over a low surface, try to find a way to stabilize 3 of 4 of your limbs. You can place a hand or elbow on your thigh or rest a knee on the surface you are reaching across. This will provide you more stability so that your muscles do not fatigue as quickly. By keeping your knees relaxed, or slightly bent, you will also reduce stress across your low back. CORRECT LIFTING TECHNIQUES DO :   Assume a wide stance. This will provide you more stability and the opportunity to get as close as possible to the object which you are lifting.  Tense your abdominals to brace your spine; then bend at the knees and hips. Keeping your back locked in a neutral-spine position, lift using your leg muscles. Lift with your legs, keeping your back straight.  Test the weight of unknown objects before attempting to lift them.  Try to keep your elbows locked down at your sides in order get the best strength from your shoulders when  carrying an object.  Always ask for help when lifting heavy or awkward objects. INCORRECT LIFTING TECHNIQUES DO NOT:   Lock your knees when lifting, even if it is a small object.  Bend and twist. Pivot at your feet or move your feet when needing to change directions.  Assume that you cannot safely pick up a paperclip without proper posture.   This information is not intended to replace advice given to you by your health care provider. Make sure you discuss any questions you have with your health care provider.   Document Released: 01/24/2005 Document Revised: 06/10/2014 Document Reviewed: 05/08/2008 [employer] Interactive Patient Education [employer]

## 2014-11-15 NOTE — ED Notes (Signed)
Declined W/C at D/C and was escorted to lobby by RN. 

## 2014-11-15 NOTE — ED Notes (Signed)
PT reports Lt arm. Pt has a appt. On 11-20-14 with MD

## 2014-11-26 ENCOUNTER — Ambulatory Visit (INDEPENDENT_AMBULATORY_CARE_PROVIDER_SITE_OTHER): Payer: Medicare Other | Admitting: Cardiovascular Disease

## 2014-11-26 ENCOUNTER — Encounter: Payer: Self-pay | Admitting: Cardiovascular Disease

## 2014-11-26 VITALS — BP 116/66 | HR 74 | Ht 67.0 in | Wt 174.4 lb

## 2014-11-26 DIAGNOSIS — I25709 Atherosclerosis of coronary artery bypass graft(s), unspecified, with unspecified angina pectoris: Secondary | ICD-10-CM | POA: Diagnosis not present

## 2014-11-26 DIAGNOSIS — Z79899 Other long term (current) drug therapy: Secondary | ICD-10-CM

## 2014-11-26 DIAGNOSIS — Z13220 Encounter for screening for lipoid disorders: Secondary | ICD-10-CM

## 2014-11-26 NOTE — Progress Notes (Signed)
Cardiology Office Note   Date:  11/27/2014   ID:  Brendan [name], DOB [birth year], MRN [id number]  PCP:  No primary care provider on file.  Cardiologist:   Madilyn Hook, MD   Chief Complaint  Patient presents with  . Coronary Artery Disease  . Chest Pain    History of Present Illness: Brendan [name] is a 64 y.o. male with CAD s/p MI 2008 s/p PCI who presents to establish care.  Brendan [name] recently moved here from [place], [place].  He does not yet have a PCP.  He reports over a year of left shoulder and arm pain that has been worse in the last 2 weeks.  The pain comes and goes and lasts for hours at a time.  It is nagging pain and there is no shortness of breath, nausea or diaphoresis.  He denies LE edema.  The pain is better with exertion.  He notes that his left hand sometimes goes numb and has a pins and needles sensation.  This happens most commonly when he is using his arm for physical activity like weed whacking. This is very different from his heart attack, when he had chest pain and nausea.  He exercises daily by walking and cycling.  He also does his lawn work.  The pain is worse when he is at rest and he never has chest pain or shortness of breath with these activities.  Brendan [name] has been improved since losing weight.  At the time of his heart attack he was nearly 250 lb.  He has increased his exercise and worked on his diet.  He is now 89 and reports that his cardiologist told him to stop lipitor and metoprolol because his lipids improved and his blood [name] was getting too low.  He denies lightheadedness or dizziness. He also denies lower extremity edema, orthopnea, or PND.    Past Medical History  Diagnosis Date  . Myocardial infarct (HCC)   . Arthritis     No past surgical history on file.   Current Outpatient Prescriptions  Medication Sig Dispense Refill  . acetaminophen-codeine (TYLENOL #3) 300-30 MG per tablet Take 1 tablet by mouth every 6  (six) hours as needed for moderate pain. 15 tablet 0  . aspirin 325 MG EC tablet Take 325 mg by mouth daily.    . clopidogrel (PLAVIX) 75 MG tablet Take 75 mg by mouth daily.     No current facility-administered medications for this visit.    Allergies:   Penicillins    Social History:  The patient  reports that he has quit smoking. He has never used smokeless tobacco. He reports that he does not drink alcohol or use illicit drugs.   Family History:  The patient's family history includes Arthritis in his mother; Cancer in his brother and sister; Diabetes Mellitus I in his father; Healthy in his sister; Hepatitis C in his brother; Hypertension in his brother; Mental illness in his sister; Pneumonia in his mother.    ROS:  Please see the history of present illness.   Otherwise, review of systems are positive for none.   All other systems are reviewed and negative.    PHYSICAL EXAM: VS:  BP 116/66 mmHg  Pulse 74  Ht 5\' 7"  (1.702 m)  Wt 79.107 kg (174 lb 6.4 oz)  BMI 27.31 kg/m2  SpO2 98% , BMI Body mass index is 27.31 kg/(m^2). GENERAL:  Well appearing HEENT:  Pupils equal round and  reactive, fundi not visualized, oral mucosa unremarkable NECK:  No jugular venous distention, waveform within normal limits, carotid upstroke brisk and symmetric, no bruits, no thyromegaly.  L trapezius tight and tender to palpation.  LYMPHATICS:  No cervical adenopathy LUNGS:  Clear to auscultation bilaterally HEART:  RRR.  PMI not displaced or sustained,S1 and S2 within normal limits, no S3, no S4, no clicks, no rubs, no murmurs ABD:  Flat, positive bowel sounds normal in frequency in pitch, no bruits, no rebound, no guarding, no midline pulsatile mass, no hepatomegaly, no splenomegaly EXT:  2 plus pulses throughout, no edema, no cyanosis no clubbing SKIN:  No rashes no nodules NEURO:  Cranial nerves II through XII grossly intact, motor grossly intact throughout PSYCH:  Cognitively intact, oriented to  person place and time    EKG:  EKG is not ordered today. The ekg from 10/30/14 demonstrates sinus bradycardia at 45 bpm.  Recent Labs: 10/30/2014: BUN 13; Creatinine, Ser 0.89; Hemoglobin 12.6*; Platelets 185; Potassium 4.1; Sodium 143    Lipid Panel No results found for: CHOL, TRIG, HDL, CHOLHDL, VLDL, LDLCALC, LDLDIRECT    Wt Readings from Last 3 Encounters:  11/26/14 79.107 kg (174 lb 6.4 oz)  11/15/14 85.73 kg (189 lb)  10/30/14 83.915 kg (185 lb)      ASSESSMENT AND PLAN:  # CAD s/p PCI: Mr. Brothers has chest pain that seems much more likely to be due to musculoskeletal or neuropathic pain that ischemia.  His symptoms occur at rest and are better with exertion.  Also, the pins and needles sensation is more likely to be more neuropathic. He continues on plavix despite the fact that his stent was placed 8 years ago.  We will obtain his records to determine why he is on lifetime plavix. He will decrease aspirin to 81 mg daily from 325 mg.  # Hypertension: BP well-controlled off metoprolol.  He is not currently on any BP agents.  This is likely due to his weight loss.  Will continue to monitor off therapy.  # Hyperlipidemia: [name] reports that his lipids were so well-controlled that his statin was discontinued.  We discussed the fact that most patients with a history of CAD s/p MI are on at least a low-dose statin regardless of their lipid numbers.  We will obtain the records from his cardiologist in [place] to determine why he is not on a statin.  We will also check his lipids.  Current medicines are reviewed at length with the patient today.  The patient does not have concerns regarding medicines.  The following changes have been made:  switch aspirin to  daily   Labs/ tests ordered today include:   Orders Placed This Encounter  Procedures  . Lipid panel  . Comprehensive metabolic panel     Disposition:   FU with [name], MD in 1 year    Signed, Madilyn Hook, MD  11/27/2014 12:33 PM    [facility]

## 2014-11-26 NOTE — Patient Instructions (Signed)
Your physician recommends that you return for lab work at your earliest convenience - FASTING.  Dr Duke Salvia recommends that you schedule a follow-up appointment in 1 year. You will receive a reminder letter in the mail two months in advance. If you don't receive a letter, please call our office to schedule the follow-up appointment.  If you need a refill on your cardiac medications before your next appointment, please call your pharmacy.

## 2014-11-27 ENCOUNTER — Telehealth: Payer: Self-pay | Admitting: Cardiovascular Disease

## 2014-11-27 ENCOUNTER — Encounter: Payer: Self-pay | Admitting: Cardiovascular Disease

## 2014-11-27 NOTE — Telephone Encounter (Signed)
Received records from [facility] as requested by Dr Duke Salvia.  Records given to Ermalene Searing, RN for Dr Duke Salvia to review.  lp

## 2014-11-27 NOTE — Telephone Encounter (Signed)
Faxed signed Auth/Release to [facility] - Dr Margette Fast to obtain records per Dr Leonides Sake request.  Faxed on 11/27/14. lp

## 2015-03-05 ENCOUNTER — Encounter: Payer: Self-pay | Admitting: Cardiovascular Disease

## 2015-03-05 NOTE — Progress Notes (Signed)
Review of prior outside records reveals:  LHC 03/06/06:  LVEF 55%.  No MR LM 0%, LAD 90% ostial, RCA 0%, LCx 0% Successful PCI of the LAD with a Taxus DES  Echo 03/06/06: LVEF 60%.  Minimal MR, minimal  TR

## 2016-03-28 DIAGNOSIS — H524 Presbyopia: Secondary | ICD-10-CM | POA: Diagnosis not present

## 2016-04-02 DIAGNOSIS — L02512 Cutaneous abscess of left hand: Secondary | ICD-10-CM | POA: Diagnosis not present

## 2016-04-02 DIAGNOSIS — L03119 Cellulitis of unspecified part of limb: Secondary | ICD-10-CM | POA: Diagnosis not present

## 2016-04-02 DIAGNOSIS — Z880 Allergy status to penicillin: Secondary | ICD-10-CM | POA: Diagnosis not present

## 2016-04-02 DIAGNOSIS — L0390 Cellulitis, unspecified: Secondary | ICD-10-CM | POA: Diagnosis not present

## 2016-04-02 DIAGNOSIS — M7989 Other specified soft tissue disorders: Secondary | ICD-10-CM | POA: Diagnosis not present

## 2016-04-02 DIAGNOSIS — R0789 Other chest pain: Secondary | ICD-10-CM | POA: Diagnosis not present

## 2016-04-02 DIAGNOSIS — R0602 Shortness of breath: Secondary | ICD-10-CM | POA: Diagnosis not present

## 2016-04-02 DIAGNOSIS — I2510 Atherosclerotic heart disease of native coronary artery without angina pectoris: Secondary | ICD-10-CM | POA: Diagnosis not present

## 2016-04-02 DIAGNOSIS — Z87891 Personal history of nicotine dependence: Secondary | ICD-10-CM | POA: Diagnosis not present

## 2016-04-02 DIAGNOSIS — F329 Major depressive disorder, single episode, unspecified: Secondary | ICD-10-CM | POA: Diagnosis not present

## 2016-04-02 DIAGNOSIS — A4901 Methicillin susceptible Staphylococcus aureus infection, unspecified site: Secondary | ICD-10-CM | POA: Diagnosis not present

## 2016-04-02 DIAGNOSIS — R6883 Chills (without fever): Secondary | ICD-10-CM | POA: Diagnosis not present

## 2016-04-02 DIAGNOSIS — R079 Chest pain, unspecified: Secondary | ICD-10-CM | POA: Diagnosis not present

## 2016-04-02 DIAGNOSIS — M79642 Pain in left hand: Secondary | ICD-10-CM | POA: Diagnosis not present

## 2016-04-02 DIAGNOSIS — F319 Bipolar disorder, unspecified: Secondary | ICD-10-CM | POA: Diagnosis not present

## 2016-04-02 DIAGNOSIS — B9562 Methicillin resistant Staphylococcus aureus infection as the cause of diseases classified elsewhere: Secondary | ICD-10-CM | POA: Diagnosis not present

## 2016-04-02 DIAGNOSIS — L03012 Cellulitis of left finger: Secondary | ICD-10-CM | POA: Diagnosis not present

## 2016-04-02 DIAGNOSIS — L03114 Cellulitis of left upper limb: Secondary | ICD-10-CM | POA: Diagnosis not present

## 2016-04-02 DIAGNOSIS — Z955 Presence of coronary angioplasty implant and graft: Secondary | ICD-10-CM | POA: Diagnosis not present

## 2016-04-02 DIAGNOSIS — I10 Essential (primary) hypertension: Secondary | ICD-10-CM | POA: Diagnosis not present

## 2016-04-20 DIAGNOSIS — Z789 Other specified health status: Secondary | ICD-10-CM | POA: Diagnosis not present

## 2016-04-20 DIAGNOSIS — I2510 Atherosclerotic heart disease of native coronary artery without angina pectoris: Secondary | ICD-10-CM | POA: Diagnosis not present

## 2016-04-20 DIAGNOSIS — I10 Essential (primary) hypertension: Secondary | ICD-10-CM | POA: Diagnosis not present

## 2016-05-16 IMAGING — DX DG CHEST 2V
2 series · 2 of 2 positions shown · non-contrast
Comparison: None.

CLINICAL DATA: Left arm tingling. Remote myocardial infarction with
stent placement. Hypertension.

EXAM:
CHEST  2 VIEW

[chest pa]
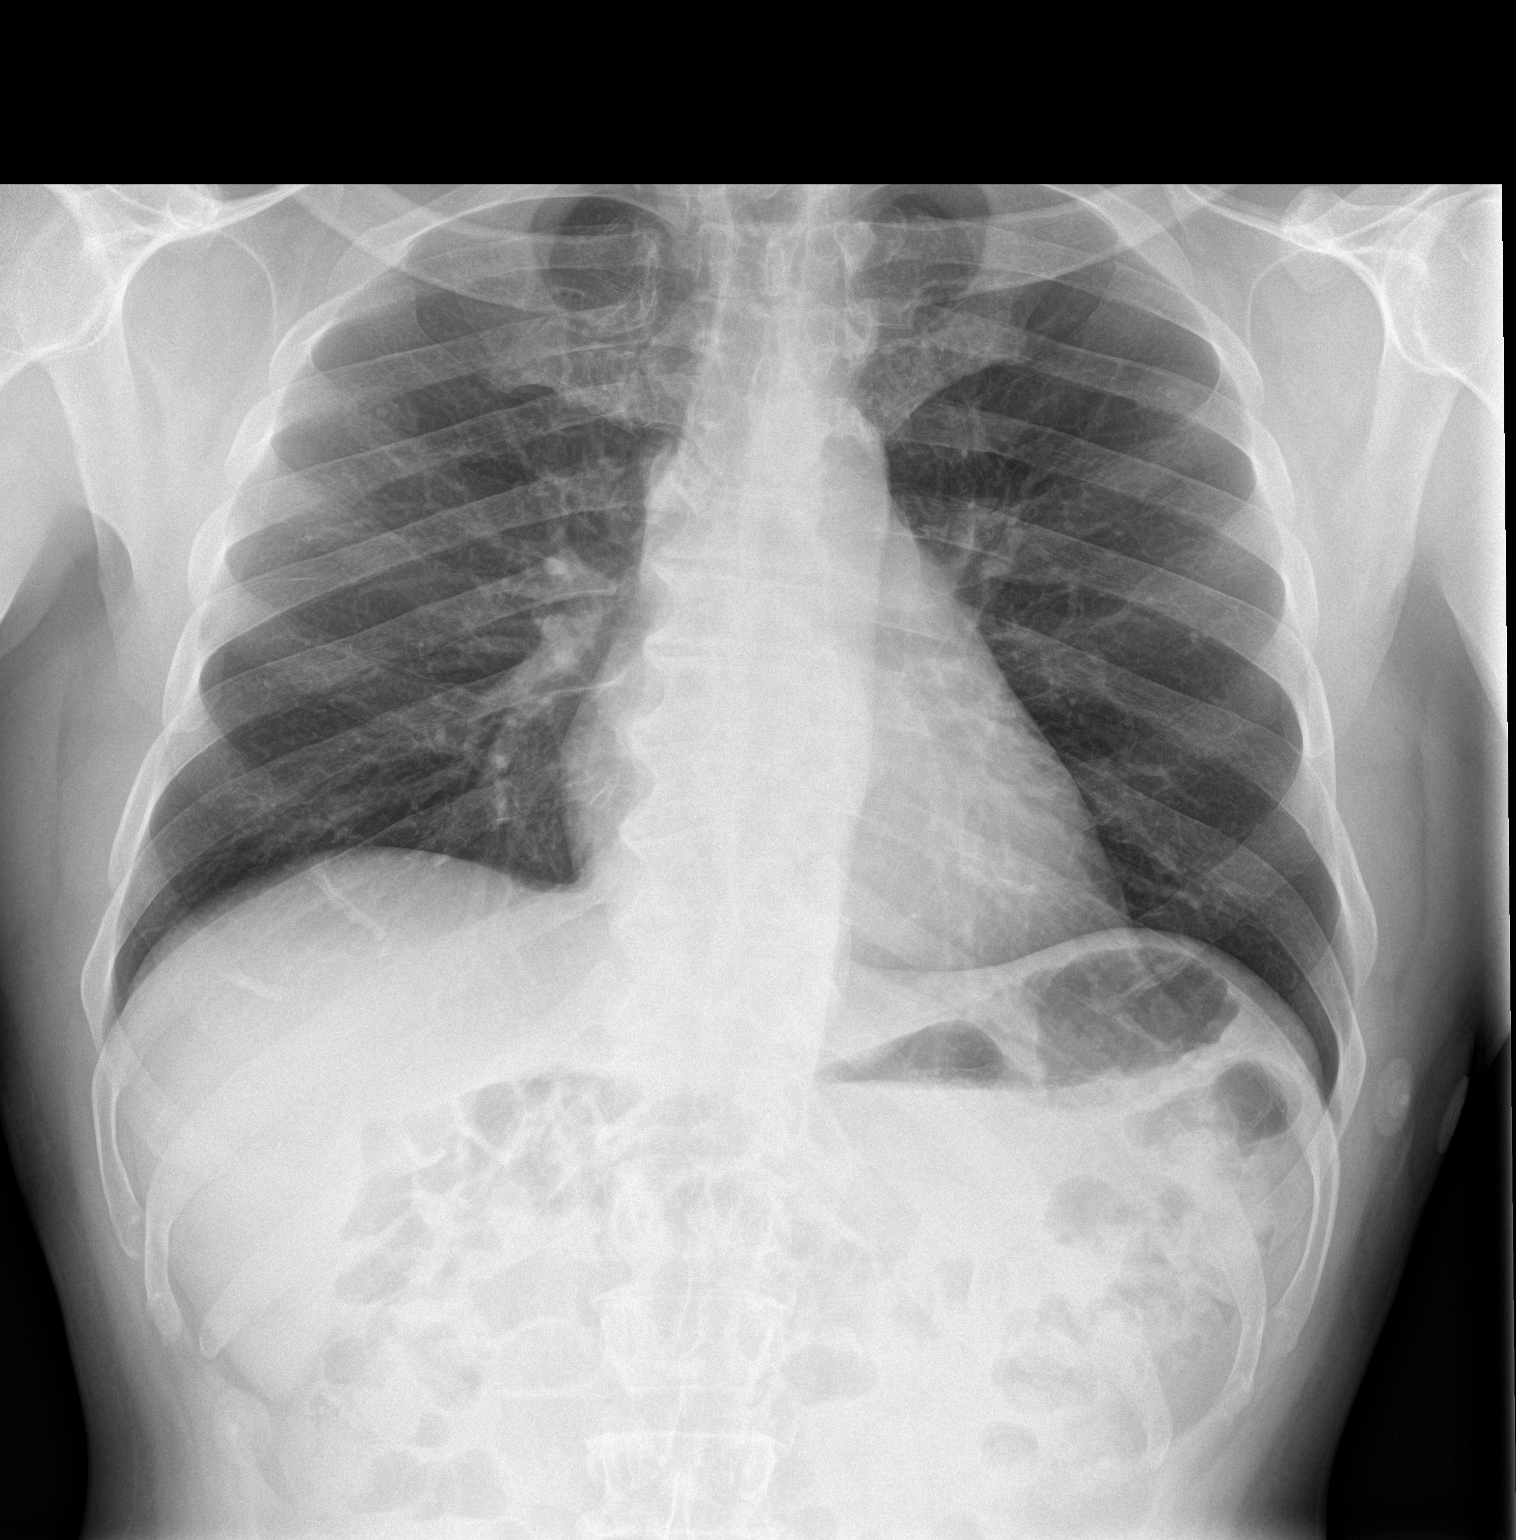

[chest lat]
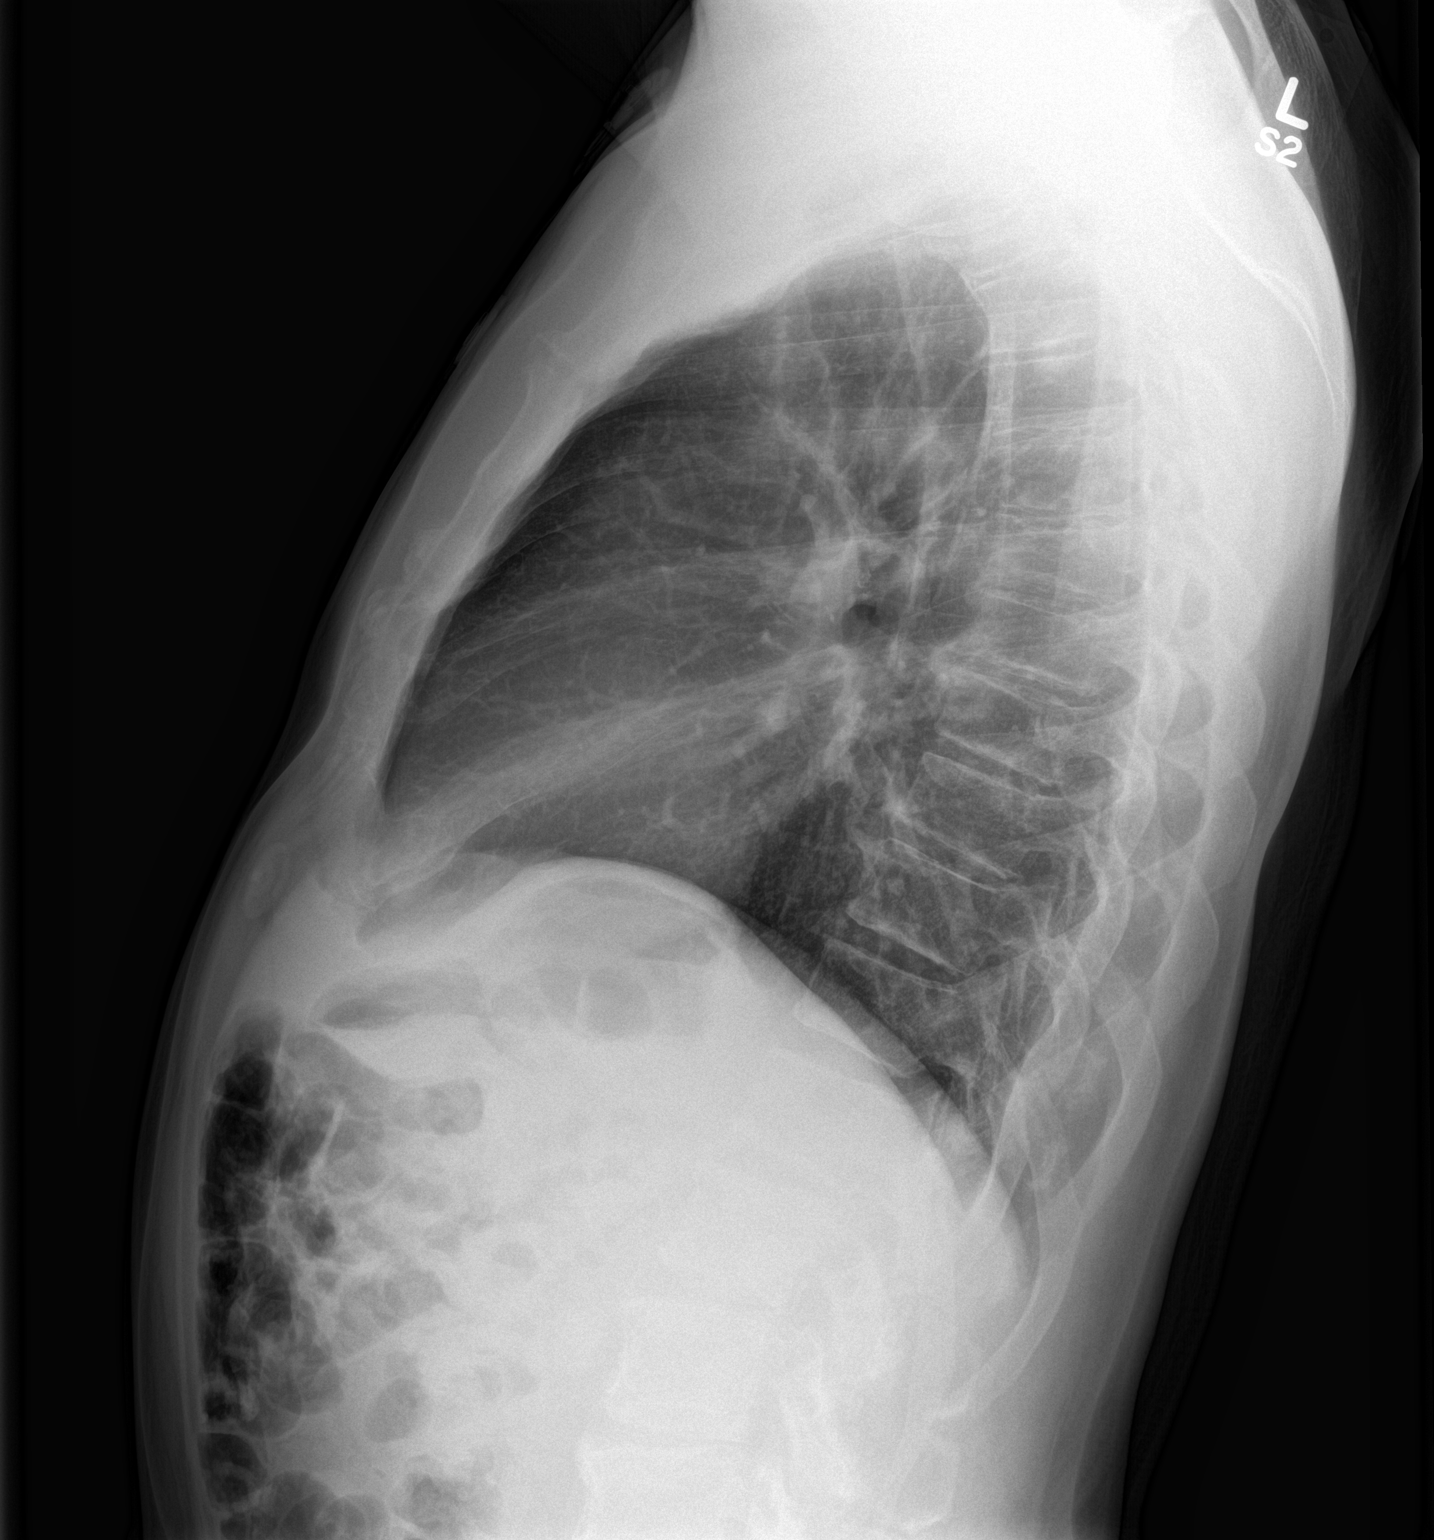

[2 of 2 positions shown; findings below may reference images not displayed]

FINDINGS: Thoracic spondylosis. Cardiac and mediastinal margins appear normal.
The lungs appear clear. No pleural effusion.
IMPRESSION: 1. No acute radiographic findings.
2. Thoracic spondylosis.

## 2016-09-22 ENCOUNTER — Encounter: Payer: Self-pay | Admitting: Podiatry

## 2016-10-12 NOTE — Progress Notes (Signed)
This encounter was created in error - please disregard.
